# Patient Record
Sex: Male | Born: 1970 | Race: White | Hispanic: No | Smoking: Current every day smoker
Health system: Southern US, Community
[De-identification: ages and names within clinical notes are randomized; demographics above are authoritative.]

## PROBLEM LIST (undated history)

## (undated) DIAGNOSIS — G43909 Migraine, unspecified, not intractable, without status migrainosus: Secondary | ICD-10-CM

---

## 2017-01-09 ENCOUNTER — Encounter (HOSPITAL_COMMUNITY): Payer: Self-pay | Admitting: Emergency Medicine

## 2017-01-09 ENCOUNTER — Emergency Department (HOSPITAL_COMMUNITY)
Admission: EM | Admit: 2017-01-09 | Discharge: 2017-01-09 | Disposition: A | Discharge status: Home / Self Care | Payer: Self-pay | Attending: Emergency Medicine | Admitting: Emergency Medicine

## 2017-01-09 ENCOUNTER — Emergency Department (HOSPITAL_COMMUNITY): Payer: Self-pay

## 2017-01-09 DIAGNOSIS — F1123 Opioid dependence with withdrawal: Secondary | ICD-10-CM | POA: Insufficient documentation

## 2017-01-09 DIAGNOSIS — Z79899 Other long term (current) drug therapy: Secondary | ICD-10-CM | POA: Insufficient documentation

## 2017-01-09 DIAGNOSIS — F1193 Opioid use, unspecified with withdrawal: Secondary | ICD-10-CM

## 2017-01-09 LAB — COMPREHENSIVE METABOLIC PANEL
ALT: 15 U/L — AB (ref 17–63)
ANION GAP: 7 (ref 5–15)
AST: 22 U/L (ref 15–41)
Albumin: 3.9 g/dL (ref 3.5–5.0)
Alkaline Phosphatase: 70 U/L (ref 38–126)
BUN: 14 mg/dL (ref 6–20)
CHLORIDE: 103 mmol/L (ref 101–111)
CO2: 27 mmol/L (ref 22–32)
CREATININE: 0.9 mg/dL (ref 0.61–1.24)
Calcium: 9 mg/dL (ref 8.9–10.3)
Glucose, Bld: 126 mg/dL — ABNORMAL HIGH (ref 65–99)
POTASSIUM: 3.6 mmol/L (ref 3.5–5.1)
Sodium: 137 mmol/L (ref 135–145)
Total Bilirubin: 0.6 mg/dL (ref 0.3–1.2)
Total Protein: 7.3 g/dL (ref 6.5–8.1)

## 2017-01-09 LAB — CBC WITH DIFFERENTIAL/PLATELET
BASOS ABS: 0.1 10*3/uL (ref 0.0–0.1)
Basophils Relative: 1 %
EOS PCT: 6 %
Eosinophils Absolute: 0.4 10*3/uL (ref 0.0–0.7)
HCT: 40.3 % (ref 39.0–52.0)
HEMOGLOBIN: 13.8 g/dL (ref 13.0–17.0)
LYMPHS ABS: 1.5 10*3/uL (ref 0.7–4.0)
LYMPHS PCT: 23 %
MCH: 29.5 pg (ref 26.0–34.0)
MCHC: 34.2 g/dL (ref 30.0–36.0)
MCV: 86.1 fL (ref 78.0–100.0)
Monocytes Absolute: 0.7 10*3/uL (ref 0.1–1.0)
Monocytes Relative: 11 %
NEUTROS ABS: 3.7 10*3/uL (ref 1.7–7.7)
NEUTROS PCT: 59 %
PLATELETS: 226 10*3/uL (ref 150–400)
RBC: 4.68 MIL/uL (ref 4.22–5.81)
RDW: 14.2 % (ref 11.5–15.5)
WBC: 6.4 10*3/uL (ref 4.0–10.5)

## 2017-01-09 LAB — I-STAT CG4 LACTIC ACID, ED: Lactic Acid, Venous: 1.14 mmol/L (ref 0.5–1.9)

## 2017-01-09 LAB — ETHANOL

## 2017-01-09 MED ORDER — DICYCLOMINE HCL 20 MG PO TABS: 20.0000 mg | ORAL_TABLET | Freq: Two times a day (BID) | ORAL | 0 refills | Status: AC | PRN

## 2017-01-09 MED ORDER — NAPROXEN 500 MG PO TABS: 500.0000 mg | ORAL_TABLET | Freq: Two times a day (BID) | ORAL | 0 refills | Status: AC

## 2017-01-09 MED ORDER — ONDANSETRON HCL 4 MG/2ML IJ SOLN
4.0000 mg | Freq: Once | INTRAMUSCULAR | Status: AC
  Administered 2017-01-09: 4 mg via INTRAVENOUS
  Filled 2017-01-09: qty 2

## 2017-01-09 MED ORDER — ONDANSETRON HCL 4 MG PO TABS: 4.0000 mg | ORAL_TABLET | Freq: Three times a day (TID) | ORAL | 0 refills | Status: AC | PRN

## 2017-01-09 MED ORDER — CLONIDINE HCL 0.1 MG PO TABS: 0.1000 mg | ORAL_TABLET | Freq: Every day | ORAL | 11 refills | Status: AC

## 2017-01-09 MED ORDER — SODIUM CHLORIDE 0.9 % IV BOLUS (SEPSIS)
1000.0000 mL | Freq: Once | INTRAVENOUS | Status: AC
  Administered 2017-01-09: 1000 mL via INTRAVENOUS

## 2017-01-09 NOTE — ED Provider Notes (Signed)
[facility]-EMERGENCY DEPT Provider Note   CSN: [id number] Arrival date & time: 01/09/17  1559     History   Chief Complaint Chief Complaint  Patient presents with  . drugs withdrawls    heroin and cocaine     HPI Christian Hill is a 46 y.o. male.  HPI Pt presents to the ED with possible drug withdrawal.  He was in police custody when he told them he was nauseated and chilled.  He has been coughing. No vomiting.  No fevers.  No diarrhea.  The last time he used any drugs ,heroine and cocaine was at 4 am.    He uses daily.   History reviewed. No pertinent past medical history.  There are no active problems to display for this patient.   History reviewed. No pertinent surgical history.     Home Medications    Prior to Admission medications   Medication Sig Start Date End Date Taking? Authorizing Provider  cloNIDine (CATAPRES) 0.1 MG tablet Take 1 tablet (0.1 mg total) by mouth daily. 01/09/17   Linwood Dibbles, MD  dicyclomine (BENTYL) 20 MG tablet Take 1 tablet (20 mg total) by mouth 2 (two) times daily as needed for spasms. 01/09/17   Linwood Dibbles, MD  naproxen (NAPROSYN) 500 MG tablet Take 1 tablet (500 mg total) by mouth 2 (two) times daily with a meal. As needed for pain 01/09/17   Linwood Dibbles, MD  ondansetron (ZOFRAN) 4 MG tablet Take 1 tablet (4 mg total) by mouth every 8 (eight) hours as needed for nausea or vomiting. 01/09/17   Linwood Dibbles, MD    Family History No family history on file.  Social History Social History  Substance Use Topics  . Smoking status: Not on file  . Smokeless tobacco: Not on file  . Alcohol use Not on file     Allergies   Patient has no known allergies.   Review of Systems Review of Systems  Constitutional: Negative for fever.  Neurological: Positive for headaches.  All other systems reviewed and are negative.    Physical Exam Updated Vital Signs BP 127/76 (BP Location: Left Arm)   Pulse 66   Temp 98.7 F (37.1 C) (Oral)   Resp 18   Ht 5'  4" (1.626 m)   Wt 68 kg   SpO2 96%   BMI 25.75 kg/m   Physical Exam  Constitutional: He appears well-developed and well-nourished. No distress.  HENT:  Head: Normocephalic and atraumatic.  Right Ear: External ear normal.  Left Ear: External ear normal.  Eyes: Conjunctivae are normal. Right eye exhibits no discharge. Left eye exhibits no discharge. No scleral icterus.  Neck: Neck supple. No tracheal deviation present.  Cardiovascular: Normal rate, regular rhythm and intact distal pulses.   Pulmonary/Chest: Effort normal and breath sounds normal. No stridor. No respiratory distress. He has no wheezes. He has no rales.  Abdominal: Soft. Bowel sounds are normal. He exhibits no distension. There is no tenderness. There is no rebound and no guarding.  Musculoskeletal: He exhibits tenderness. He exhibits no edema.  Mild erythema left ac and upper forearm, ttp, no fluctuance, no induration, no lymphangitic streaking  Neurological: He is alert. He has normal strength. No cranial nerve deficit (no facial droop, extraocular movements intact, no slurred speech) or sensory deficit. He exhibits normal muscle tone. He displays no seizure activity. Coordination normal.  Skin: Skin is warm and dry.  Psychiatric: He has a normal mood and affect.  Nursing note and vitals reviewed.  ED Treatments / Results  Labs (all labs ordered are listed, but only abnormal results are displayed) Labs Reviewed  COMPREHENSIVE METABOLIC PANEL - Abnormal; Notable for the following:       Result Value   Glucose, Bld 126 (*)    ALT 15 (*)    All other components within normal limits  ETHANOL  CBC WITH DIFFERENTIAL/PLATELET  HIV ANTIBODY (ROUTINE TESTING)  I-STAT CG4 LACTIC ACID, ED    EKG  EKG Interpretation None       Radiology Dg Chest 2 View  Result Date: 01/09/2017 CLINICAL DATA:  Chills, shortness of breath and chest pain for 4 days. EXAM: CHEST  2 VIEW COMPARISON:  None. FINDINGS: The cardiac  silhouette, mediastinal and hilar contours are within normal limits. There is a small right-sided pleural effusion with minimal overlying atelectasis. The left lung is clear. The bony thorax is intact. IMPRESSION: Right-sided pleural effusion but no definite infiltrates or edema. Electronically Signed   By: Rudie Meyer M.D.   On: 01/09/2017 17:52    Procedures Procedures (including critical care time)  Medications Ordered in ED Medications  sodium chloride 0.9 % bolus 1,000 mL (1,000 mLs Intravenous New Bag/Given 01/09/17 1718)  ondansetron (ZOFRAN) injection 4 mg (4 mg Intravenous Given 01/09/17 1717)     Initial Impression / Assessment and Plan / ED Course  I have reviewed the triage vital signs and the nursing notes.  Pertinent labs & imaging results that were available during my care of the patient were reviewed by me and considered in my medical decision making (see chart for details).   patient does not have any evidence of infection or acute laboratory abnormalities.  The patient's symptoms could be related to opiate withdrawal. He denies any suicidal or homicidal ideation. Patient is in police custody. He is stable to be released. I will give him prescriptions to help with opiate withdrawal. Final Clinical Impressions(s) / ED Diagnoses   Final diagnoses:  Opiate withdrawal (HCC)    New Prescriptions New Prescriptions   CLONIDINE (CATAPRES) 0.1 MG TABLET    Take 1 tablet (0.1 mg total) by mouth daily.   DICYCLOMINE (BENTYL) 20 MG TABLET    Take 1 tablet (20 mg total) by mouth 2 (two) times daily as needed for spasms.   NAPROXEN (NAPROSYN) 500 MG TABLET    Take 1 tablet (500 mg total) by mouth 2 (two) times daily with a meal. As needed for pain   ONDANSETRON (ZOFRAN) 4 MG TABLET    Take 1 tablet (4 mg total) by mouth every 8 (eight) hours as needed for nausea or vomiting.     Linwood Dibbles, MD 01/09/17 [phone]

## 2017-01-09 NOTE — ED Notes (Signed)
Bed: ZO10 Expected date:  Expected time:  Means of arrival:  Comments: EMS heroin

## 2017-01-09 NOTE — Discharge Instructions (Signed)
Please refer to the discharge instructions regarding outpatient resources to help you with your opiate use

## 2017-01-09 NOTE — ED Triage Notes (Signed)
Per EMS pt was under police custody when pt claimed been nauseated and reported heroin and cocaine use and alcohol this AM. Pt was diaphoretic upon arrival. Denies pain , no emesis. No trumors per EMS. Alert and oriented x 4.

## 2017-01-10 LAB — HIV ANTIBODY (ROUTINE TESTING W REFLEX): HIV Screen 4th Generation wRfx: NONREACTIVE

## 2021-10-27 ENCOUNTER — Emergency Department (HOSPITAL_COMMUNITY)
Admission: EM | Admit: 2021-10-27 | Discharge: 2021-10-27 | Disposition: A | Discharge status: Home / Self Care | Payer: Commercial Managed Care - HMO | Attending: Emergency Medicine | Admitting: Emergency Medicine

## 2021-10-27 ENCOUNTER — Emergency Department (HOSPITAL_COMMUNITY): Payer: Commercial Managed Care - HMO

## 2021-10-27 ENCOUNTER — Encounter (HOSPITAL_COMMUNITY): Payer: Self-pay | Admitting: Emergency Medicine

## 2021-10-27 ENCOUNTER — Other Ambulatory Visit: Payer: Self-pay

## 2021-10-27 DIAGNOSIS — R630 Anorexia: Secondary | ICD-10-CM | POA: Insufficient documentation

## 2021-10-27 DIAGNOSIS — R0602 Shortness of breath: Secondary | ICD-10-CM | POA: Diagnosis not present

## 2021-10-27 DIAGNOSIS — Z20822 Contact with and (suspected) exposure to covid-19: Secondary | ICD-10-CM | POA: Insufficient documentation

## 2021-10-27 DIAGNOSIS — R059 Cough, unspecified: Secondary | ICD-10-CM | POA: Diagnosis not present

## 2021-10-27 DIAGNOSIS — R1013 Epigastric pain: Secondary | ICD-10-CM | POA: Insufficient documentation

## 2021-10-27 HISTORY — DX: Migraine, unspecified, not intractable, without status migrainosus: G43.909

## 2021-10-27 LAB — COMPREHENSIVE METABOLIC PANEL
ALT: 15 U/L (ref 0–44)
AST: 22 U/L (ref 15–41)
Albumin: 3.8 g/dL (ref 3.5–5.0)
Alkaline Phosphatase: 54 U/L (ref 38–126)
Anion gap: 8 (ref 5–15)
BUN: 9 mg/dL (ref 6–20)
CO2: 29 mmol/L (ref 22–32)
Calcium: 9.6 mg/dL (ref 8.9–10.3)
Chloride: 100 mmol/L (ref 98–111)
Creatinine, Ser: 1.06 mg/dL (ref 0.61–1.24)
GFR, Estimated: >60 mL/min (ref >60)
Glucose, Bld: 94 mg/dL (ref 70–99)
Potassium: 4.1 mmol/L (ref 3.5–5.1)
Sodium: 137 mmol/L (ref 135–145)
Total Bilirubin: 0.3 mg/dL (ref 0.3–1.2)
Total Protein: 7.1 g/dL (ref 6.5–8.1)

## 2021-10-27 LAB — CBC
HCT: 44.3 % (ref 39.0–52.0)
Hemoglobin: 15.1 g/dL (ref 13.0–17.0)
MCH: 30.3 pg (ref 26.0–34.0)
MCHC: 34.1 g/dL (ref 30.0–36.0)
MCV: 89 fL (ref 80.0–100.0)
Platelets: 254 10*3/uL (ref 150–400)
RBC: 4.98 MIL/uL (ref 4.22–5.81)
RDW: 13.1 % (ref 11.5–15.5)
WBC: 7.8 10*3/uL (ref 4.0–10.5)
nRBC: 0 % (ref 0.0–0.2)

## 2021-10-27 LAB — RESP PANEL BY RT-PCR (FLU A&B, COVID) ARPGX2
Influenza A by PCR: NEGATIVE
Influenza B by PCR: NEGATIVE
SARS Coronavirus 2 by RT PCR: NEGATIVE

## 2021-10-27 LAB — LIPASE, BLOOD: Lipase: 30 U/L (ref 11–51)

## 2021-10-27 LAB — TROPONIN I (HIGH SENSITIVITY)
Troponin I (High Sensitivity): 4 ng/L (ref <18)
Troponin I (High Sensitivity): 3 ng/L (ref <18)

## 2021-10-27 MED ORDER — IOHEXOL 350 MG/ML SOLN
80.0000 mL | Freq: Once | INTRAVENOUS | Status: AC | PRN
  Administered 2021-10-27: 80 mL via INTRAVENOUS

## 2021-10-27 NOTE — ED Notes (Signed)
Patient transported to CT 

## 2021-10-27 NOTE — Discharge Instructions (Signed)
Follow-up with your primary care doctor.  Your work-up was reassuring today.

## 2021-10-27 NOTE — ED Triage Notes (Signed)
Patient here with complaint of shortness of breath and left sided chest pain that started approximately one month ago, history of pneumonia in 2008. Patient alert, oriented, speaking in complete sentences, and is in no apparent distress at this time.

## 2021-10-27 NOTE — ED Provider Triage Note (Signed)
Emergency Medicine Provider Triage Evaluation Note  Christian Hill , a 51 y.o. male  was evaluated in triage.  Pt complains of shortness of breath and intermittent chest pain and left side pain over the past 1 month.  He is a smoker.  He has had a cough.  States that symptoms are getting worse.  No reported fevers.  Review of Systems  Positive: Shortness of breath, chest pain Negative: Fevers  Physical Exam  BP 108/80 (BP Location: Right Arm)    Pulse 94    Temp 97.6 F (36.4 C) (Oral)    Resp 16    SpO2 98%  Gen:   Awake, no distress   Resp:  Shallow breathing, lungs clear but decreased air movement MSK:   Moves extremities without difficulty  Other:    Medical Decision Making  Medically screening exam initiated at 1:11 PM.  Appropriate orders placed.  [name] was informed that the remainder of the evaluation will be completed by another provider, this initial triage assessment does not replace that evaluation, and the importance of remaining in the ED until their evaluation is complete.     Renne Crigler, PA-C 10/27/21 1312

## 2021-10-27 NOTE — ED Provider Notes (Signed)
[facility] EMERGENCY DEPARTMENT Provider Note   CSN: [id number] Arrival date & time: 10/27/21  1216     History  Chief Complaint  Patient presents with   Shortness of Breath    Christian Hill is a 51 y.o. male.   Shortness of Breath Associated symptoms: abdominal pain and chest pain   Associated symptoms: no fever   Patient presents with shortness of breath abdominal pain.  Occasional cough.  States it has been going for a month.  States it hurts in his left lower chest.  States he is lost 2 inches on his waist.  Decreased appetite.  He is a smoker.  States he had previous pneumonia and required a drain.  States he thought this was on the left side of the chest but it appears that it was on the right side.  Pain not worse after eating.  No swelling in his legs.  Remote history of IV drug use.  Not really having back pain more lateral to abdominal pain.    Home Medications Prior to Admission medications   Medication Sig Start Date End Date Taking? Authorizing Provider  cloNIDine (CATAPRES) 0.1 MG tablet Take 1 tablet (0.1 mg total) by mouth daily. 01/09/17   Linwood Dibbles, MD  dicyclomine (BENTYL) 20 MG tablet Take 1 tablet (20 mg total) by mouth 2 (two) times daily as needed for spasms. 01/09/17   Linwood Dibbles, MD  naproxen (NAPROSYN) 500 MG tablet Take 1 tablet (500 mg total) by mouth 2 (two) times daily with a meal. As needed for pain 01/09/17   Linwood Dibbles, MD  ondansetron (ZOFRAN) 4 MG tablet Take 1 tablet (4 mg total) by mouth every 8 (eight) hours as needed for nausea or vomiting. 01/09/17   Linwood Dibbles, MD      Allergies    Patient has no known allergies.    Review of Systems   Review of Systems  Constitutional:  Positive for appetite change and unexpected weight change. Negative for fever.  HENT:  Negative for congestion.   Respiratory:  Positive for shortness of breath.   Cardiovascular:  Positive for chest pain.  Gastrointestinal:  Positive for abdominal  pain.  Genitourinary:  Negative for flank pain.  Musculoskeletal:  Negative for back pain.  Neurological:  Negative for weakness.   Physical Exam Updated Vital Signs BP (!) 103/55    Pulse (!) 53    Temp (!) 97.4 F (36.3 C) (Oral)    Resp (!) 24    Ht 5\' 4"  (1.626 m)    Wt 68 kg    SpO2 96%    BMI 25.75 kg/m  Physical Exam Vitals and nursing note reviewed.  HENT:     Head: Normocephalic.  Cardiovascular:     Rate and Rhythm: Normal rate.  Pulmonary:     Comments: Scars on right chest wall from previous chest tube and surgery. Chest:     Chest wall: No tenderness.  Abdominal:     Tenderness: There is abdominal tenderness.     Comments: Epigastric to left upper quadrant abdominal tenderness.  No rebound or guarding.  No hernias palpated.  Musculoskeletal:     Cervical back: Neck supple.     Right lower leg: No edema.     Left lower leg: No edema.     Comments: No spinal tenderness.  Skin:    General: Skin is warm.     Capillary Refill: Capillary refill takes less than 2 seconds.  Neurological:  Mental Status: He is alert and oriented to person, place, and time.    ED Results / Procedures / Treatments   Labs (all labs ordered are listed, but only abnormal results are displayed) Labs Reviewed  RESP PANEL BY RT-PCR (FLU A&B, COVID) [id number]  CBC  COMPREHENSIVE METABOLIC PANEL  LIPASE, BLOOD  TROPONIN I (HIGH SENSITIVITY)  TROPONIN I (HIGH SENSITIVITY)    EKG EKG Interpretation  Date/Time:  Wednesday October 27 2021 13:01:37 EST Ventricular Rate:  61 PR Interval:  146 QRS Duration: 84 QT Interval:  388 QTC Calculation: 390 R Axis:   75 Text Interpretation: Normal sinus rhythm Normal ECG No previous ECGs available Confirmed by Benjiman CorePickering, Maddox Hlavaty [phone]([id number]) on 10/27/2021 3:29:19 PM  Radiology DG Chest 2 View  Result Date: 10/27/2021 CLINICAL DATA:  Shortness of breath, left-sided chest pain, remote history of pneumonia EXAM: CHEST - 2 VIEW COMPARISON:  01/09/2017  FINDINGS: The heart size and mediastinal contours are within normal limits. Unchanged elevation of the right hemidiaphragm with associated pleural thickening or chronic, loculated pleural effusion. The lungs are otherwise normally aerated. The visualized skeletal structures are unremarkable. IMPRESSION: Unchanged elevation of the right hemidiaphragm with associated pleural thickening or chronic, loculated pleural effusion. There is no acute appearing airspace opacity. Electronically Signed   By: Jearld LeschAlex D Bibbey M.D.   On: 10/27/2021 13:36   CT CHEST ABDOMEN PELVIS W CONTRAST  Result Date: 10/27/2021 CLINICAL DATA:  Left upper quadrant abdominal pain. Shortness of breath. EXAM: CT CHEST, ABDOMEN, AND PELVIS WITH CONTRAST TECHNIQUE: Multidetector CT imaging of the chest, abdomen and pelvis was performed following the standard protocol during bolus administration of intravenous contrast. RADIATION DOSE REDUCTION: This exam was performed according to the departmental dose-optimization program which includes automated exposure control, adjustment of the mA and/or kV according to patient size and/or use of iterative reconstruction technique. CONTRAST:  80mL OMNIPAQUE IOHEXOL 350 MG/ML SOLN COMPARISON:  None. FINDINGS: CT CHEST FINDINGS Cardiovascular: No significant vascular findings. Normal heart size. No pericardial effusion. Mediastinum/Nodes: No enlarged mediastinal, hilar, or axillary lymph nodes. Thyroid gland, trachea, and esophagus demonstrate no significant findings. Lungs/Pleura: There is minimal scarring in the right lower lobe. The lungs are otherwise clear. There is no pleural effusion or pneumothorax. Musculoskeletal: A portion of the right eighth rib is missing which may be related to prior surgery. No acute fractures are seen. CT ABDOMEN PELVIS FINDINGS Hepatobiliary: No focal liver abnormality is seen. No gallstones, gallbladder wall thickening, or biliary dilatation. Pancreas: Unremarkable. No  pancreatic ductal dilatation or surrounding inflammatory changes. Spleen: Normal in size without focal abnormality. Adrenals/Urinary Tract: Adrenal glands are unremarkable. Kidneys are normal, without renal calculi, focal lesion, or hydronephrosis. Bladder is unremarkable. Stomach/Bowel: Stomach is within normal limits. Appendix appears normal. No evidence of bowel wall thickening, distention, or inflammatory changes. Vascular/Lymphatic: Aortic atherosclerosis. No enlarged abdominal or pelvic lymph nodes. Reproductive: Prostate is unremarkable. Other: No abdominal wall hernia or abnormality. No abdominopelvic ascites. Musculoskeletal: No acute or significant osseous findings. IMPRESSION: 1. No acute localizing process in the chest, abdomen or pelvis. Electronically Signed   By: Darliss CheneyAmy  Guttmann M.D.   On: 10/27/2021 17:42    Procedures Procedures    Medications Ordered in ED Medications  iohexol (OMNIPAQUE) 350 MG/ML injection 80 mL (80 mLs Intravenous Contrast Given 10/27/21 1733)    ED Course/ Medical Decision Making/ A&P Clinical Course as of 10/28/21 0001  Wed Oct 27, 2021  1625 Troponin I (High Sensitivity) normal [NP]  1625 Comprehensive metabolic panel Reassuring  [  NP]  1625 DG Chest 2 View Chronic right sided changes [NP]    Clinical Course User Index [NP] Benjiman Core, MD                           Medical Decision Making Amount and/or Complexity of Data Reviewed Labs: ordered. Decision-making details documented in ED Course. Radiology: ordered. Decision-making details documented in ED Course.  Risk Prescription drug management.   Patient presents with chest pain and abdominal pain.  Has had for the last month.  It is on the left lower chest and left upper abdomen.  States worse with breathing.  States worse with eating and certain positions.  No fevers or chills.  Has lost 2 inches of his waist.  States he has not been trying to lose the weight.  Somewhat decreased  appetite.  Lab work done reassuring.  Chest x-ray did not show anything on the left side of the penis.  CT scan done to evaluate for occult malignancy.  None seen.  Appears stable for discharge home.  Will have follow-up with PCP.  Does not appear to meet admission to the hospital for this.  Initial differential diagnosis for chest and abdominal pain is long time includes diagnose such as chest pain, tumors, anemia        Final Clinical Impression(s) / ED Diagnoses Final diagnoses:  Epigastric pain    Rx / DC Orders ED Discharge Orders     None         Benjiman Core, MD 10/28/21 0001

## 2021-11-22 ENCOUNTER — Emergency Department (HOSPITAL_COMMUNITY): Payer: Commercial Managed Care - HMO

## 2021-11-22 ENCOUNTER — Emergency Department (HOSPITAL_COMMUNITY)
Admission: EM | Admit: 2021-11-22 | Discharge: 2021-11-22 | Disposition: A | Discharge status: Home / Self Care | Payer: Commercial Managed Care - HMO | Attending: Emergency Medicine | Admitting: Emergency Medicine

## 2021-11-22 ENCOUNTER — Encounter (HOSPITAL_COMMUNITY): Payer: Self-pay | Admitting: Emergency Medicine

## 2021-11-22 DIAGNOSIS — R1032 Left lower quadrant pain: Secondary | ICD-10-CM | POA: Diagnosis not present

## 2021-11-22 DIAGNOSIS — R519 Headache, unspecified: Secondary | ICD-10-CM

## 2021-11-22 LAB — COMPREHENSIVE METABOLIC PANEL
ALT: 16 U/L (ref 0–44)
AST: 25 U/L (ref 15–41)
Albumin: 3.8 g/dL (ref 3.5–5.0)
Alkaline Phosphatase: 62 U/L (ref 38–126)
Anion gap: 8 (ref 5–15)
BUN: 10 mg/dL (ref 6–20)
CO2: 25 mmol/L (ref 22–32)
Calcium: 9 mg/dL (ref 8.9–10.3)
Chloride: 104 mmol/L (ref 98–111)
Creatinine, Ser: 1.05 mg/dL (ref 0.61–1.24)
GFR, Estimated: >60 mL/min (ref >60)
Glucose, Bld: 89 mg/dL (ref 70–99)
Potassium: 3.9 mmol/L (ref 3.5–5.1)
Sodium: 137 mmol/L (ref 135–145)
Total Bilirubin: 0.4 mg/dL (ref 0.3–1.2)
Total Protein: 7 g/dL (ref 6.5–8.1)

## 2021-11-22 LAB — CBC WITH DIFFERENTIAL/PLATELET
Abs Immature Granulocytes: 0.02 10*3/uL (ref 0.00–0.07)
Basophils Absolute: 0.1 10*3/uL (ref 0.0–0.1)
Basophils Relative: 1 %
Eosinophils Absolute: 0.3 10*3/uL (ref 0.0–0.5)
Eosinophils Relative: 4 %
HCT: 42 % (ref 39.0–52.0)
Hemoglobin: 14 g/dL (ref 13.0–17.0)
Immature Granulocytes: 0 %
Lymphocytes Relative: 23 %
Lymphs Abs: 1.6 10*3/uL (ref 0.7–4.0)
MCH: 30.1 pg (ref 26.0–34.0)
MCHC: 33.3 g/dL (ref 30.0–36.0)
MCV: 90.3 fL (ref 80.0–100.0)
Monocytes Absolute: 0.6 10*3/uL (ref 0.1–1.0)
Monocytes Relative: 9 %
Neutro Abs: 4.5 10*3/uL (ref 1.7–7.7)
Neutrophils Relative %: 63 %
Platelets: 226 10*3/uL (ref 150–400)
RBC: 4.65 MIL/uL (ref 4.22–5.81)
RDW: 13.2 % (ref 11.5–15.5)
WBC: 7.1 10*3/uL (ref 4.0–10.5)
nRBC: 0 % (ref 0.0–0.2)

## 2021-11-22 MED ORDER — DEXAMETHASONE SODIUM PHOSPHATE 10 MG/ML IJ SOLN
10.0000 mg | Freq: Once | INTRAMUSCULAR | Status: AC
  Administered 2021-11-22: 10 mg via INTRAVENOUS
  Filled 2021-11-22: qty 1

## 2021-11-22 MED ORDER — METOCLOPRAMIDE HCL 5 MG/ML IJ SOLN
5.0000 mg | Freq: Once | INTRAMUSCULAR | Status: AC
  Administered 2021-11-22: 5 mg via INTRAVENOUS
  Filled 2021-11-22: qty 2

## 2021-11-22 MED ORDER — KETOROLAC TROMETHAMINE 30 MG/ML IJ SOLN
15.0000 mg | Freq: Once | INTRAMUSCULAR | Status: AC
  Administered 2021-11-22: 15 mg via INTRAVENOUS
  Filled 2021-11-22: qty 1

## 2021-11-22 MED ORDER — DIPHENHYDRAMINE HCL 50 MG/ML IJ SOLN
12.5000 mg | Freq: Once | INTRAMUSCULAR | Status: AC
  Administered 2021-11-22: 12.5 mg via INTRAVENOUS
  Filled 2021-11-22: qty 1

## 2021-11-22 MED ORDER — BUTALBITAL-APAP-CAFFEINE 50-325-40 MG PO TABS: 1.0000 | ORAL_TABLET | Freq: Three times a day (TID) | ORAL | 0 refills | Status: AC | PRN

## 2021-11-22 MED ORDER — SODIUM CHLORIDE 0.9 % IV BOLUS
500.0000 mL | Freq: Once | INTRAVENOUS | Status: AC
  Administered 2021-11-22: 500 mL via INTRAVENOUS

## 2021-11-22 NOTE — Discharge Instructions (Signed)

## 2021-11-22 NOTE — ED Provider Triage Note (Signed)
Emergency Medicine Provider Triage Evaluation Note  Christian Hill , a 51 y.o. male  was evaluated in triage.  Pt complains of headache that has been ongoing for years but worse over the last week.  Hurts when he moves his head.  No visual disturbances.  No weakness or numbness.  Review of Systems  Positive:  Negative: See above   Physical Exam  BP 106/73 (BP Location: Right Arm)    Pulse 79    Temp 98 F (36.7 C) (Oral)    Resp 16    SpO2 96%  Gen:   Awake, no distress   Resp:  Normal effort  MSK:   Moves extremities without difficulty  Other:    Medical Decision Making  Medically screening exam initiated at 12:49 PM.  Appropriate orders placed.  Christian Hill was informed that the remainder of the evaluation will be completed by another provider, this initial triage assessment does not replace that evaluation, and the importance of remaining in the ED until their evaluation is complete.     Christian Hill, [place] 11/22/21 1249

## 2021-11-22 NOTE — ED Provider Notes (Signed)
[facility] EMERGENCY DEPARTMENT Provider Note   CSN: [id number] Arrival date & time: 11/22/21  1158     History  Chief Complaint  Patient presents with   Headache    Christian Hill is a 51 y.o. male who presents emergency department with chief complaint of headache.  Patient complains that he has had an ongoing headache for the past 2 weeks which has been intermittent.  He states that the pain starts in his right occiput and goes across his entire right side of his head.  It is at times throbbing and hurts behind his eye.  He has been taking ibuprofen almost every 2 hours without any improvement in his headache and has been constant for the past 2 days.  He has some associated light sensitivity.  He has never had headaches like this in the past.  He denies nausea, vomiting, fever, sudden onset.  Patient also complains of pain in his left abdominal region.  He states that he thinks he pulled a muscle when playing basketball with some "young guys."  Pain is worse when he tries to sit up or twist.  He has no other complaints.   Headache     Home Medications Prior to Admission medications   Medication Sig Start Date End Date Taking? Authorizing Provider  cloNIDine (CATAPRES) 0.1 MG tablet Take 1 tablet (0.1 mg total) by mouth daily. 01/09/17   Linwood Dibbles, MD  dicyclomine (BENTYL) 20 MG tablet Take 1 tablet (20 mg total) by mouth 2 (two) times daily as needed for spasms. 01/09/17   Linwood Dibbles, MD  naproxen (NAPROSYN) 500 MG tablet Take 1 tablet (500 mg total) by mouth 2 (two) times daily with a meal. As needed for pain 01/09/17   Linwood Dibbles, MD  ondansetron (ZOFRAN) 4 MG tablet Take 1 tablet (4 mg total) by mouth every 8 (eight) hours as needed for nausea or vomiting. 01/09/17   Linwood Dibbles, MD      Allergies    Patient has no known allergies.    Review of Systems   Review of Systems  Neurological:  Positive for headaches.   Physical Exam Updated Vital Signs BP  106/73 (BP Location: Right Arm)    Pulse 79    Temp 98 F (36.7 C) (Oral)    Resp 16    SpO2 96%  Physical Exam Physical Exam  Constitutional: Pt is oriented to person, place, and time. Pt appears well-developed and well-nourished. No distress.  HENT:  Head: Normocephalic and atraumatic.  Mouth/Throat: Oropharynx is clear and moist.  Eyes: Conjunctivae and EOM are normal. Pupils are equal, round, and reactive to light. No scleral icterus.  No horizontal, vertical or rotational nystagmus  Neck: Normal range of motion. Neck supple.  Full active and passive ROM Tender trigger point in the R suboccipital region. Palpation reproduces pain of complaint. No midline or tenderness No nuchal rigidity or meningeal signs  Cardiovascular: Normal rate, regular rhythm and intact distal pulses.   Pulmonary/Chest: Effort normal and breath sounds normal. No respiratory distress. Pt has no wheezes. No rales.  Abdominal: Soft. Bowel sounds are normal. There is no tenderness. There is no rebound and no guarding.  Musculoskeletal: Normal range of motion.  Lymphadenopathy:    No cervical adenopathy.  Neurological: Pt. is alert and oriented to person, place, and time. He has normal reflexes. No cranial nerve deficit.  Exhibits normal muscle tone. Coordination normal.  Mental Status:  Alert, oriented, thought content appropriate. Speech fluent  without evidence of aphasia. Able to follow 2 step commands without difficulty.  Cranial Nerves:  II:  Peripheral visual fields grossly normal, pupils equal, round, reactive to light III,IV, VI: ptosis not present, extra-ocular motions intact bilaterally  V,VII: smile symmetric, facial light touch sensation equal VIII: hearing grossly normal bilaterally  IX,X: midline uvula rise  XI: bilateral shoulder shrug equal and strong XII: midline tongue extension  Motor:  5/5 in upper and lower extremities bilaterally including strong and equal grip strength and  dorsiflexion/plantar flexion Sensory: Pinprick and light touch normal in all extremities.  Deep Tendon Reflexes: 2+ and symmetric  Cerebellar: normal finger-to-nose with bilateral upper extremities Gait: normal gait and balance CV: distal pulses palpable throughout   Skin: Skin is warm and dry. No rash noted. Pt is not diaphoretic.  Psychiatric: Pt has a normal mood and affect. Behavior is normal. Judgment and thought content normal.  Nursing note and vitals reviewed.  ED Results / Procedures / Treatments   Labs (all labs ordered are listed, but only abnormal results are displayed) Labs Reviewed  COMPREHENSIVE METABOLIC PANEL  CBC WITH DIFFERENTIAL/PLATELET  URINALYSIS, ROUTINE W REFLEX MICROSCOPIC    EKG None  Radiology CT Head Wo Contrast  Result Date: 11/22/2021 CLINICAL DATA:  Headache. EXAM: CT HEAD WITHOUT CONTRAST TECHNIQUE: Contiguous axial images were obtained from the base of the skull through the vertex without intravenous contrast. RADIATION DOSE REDUCTION: This exam was performed according to the departmental dose-optimization program which includes automated exposure control, adjustment of the mA and/or kV according to patient size and/or use of iterative reconstruction technique. COMPARISON:  None. FINDINGS: Brain: No evidence of acute infarction, hemorrhage, hydrocephalus, extra-axial collection or mass lesion/mass effect. Vascular: No hyperdense vessel or unexpected calcification. Skull: Normal. Negative for fracture or focal lesion. Sinuses/Orbits: No acute finding. Other: None. IMPRESSION: No acute intracranial abnormality seen. Electronically Signed   By: Marijo Conception M.D.   On: 11/22/2021 13:28    Procedures Procedures    Medications Ordered in ED Medications  sodium chloride 0.9 % bolus 500 mL (has no administration in time range)  metoCLOPramide (REGLAN) injection 5 mg (has no administration in time range)  diphenhydrAMINE (BENADRYL) injection 12.5 mg (has  no administration in time range)  ketorolac (TORADOL) 30 MG/ML injection 15 mg (has no administration in time range)  dexamethasone (DECADRON) injection 10 mg (has no administration in time range)    ED Course/ Medical Decision Making/ A&P Clinical Course as of 11/22/21 1541  Mon Nov 22, 2021  1540 CBC with Differential [AH]  1540 Comprehensive metabolic panel Labs WNL  [AH]  1540 CT Head Wo Contrast I visualized CT head, no acute findings on CT scan.  Agreed with radiologic interpretation. [AH]    Clinical Course User Index [AH] Margarita Mail, PA-C                           Medical Decision Making Christian Hill presents with headache Given the large differential diagnosis for Christian Hill, the decision making in this case is of high complexity.  After evaluating all of the data points in this case, the presentation of Christian Hill is NOT consistent with skull fracture, meningitis/encephalitis, SAH/sentinel bleed, Intracranial Hemorrhage (ICH) (subdural/epidural), acute obstructive hydrocephalus, space occupying lesions, CVA, CO Poisoning, Basilar/vertebral artery dissection, preeclampsia, cerebral venous thrombosis, hypertensive emergency, temporal Arteritis, Idiopathic Intracranial Hypertension (pseudotumor cerebri).  Strict return and follow-up precautions have been given by me personally or by  detailed written instructions verbalized by nursing staff using the teach back method to patient/family/caregiver.  Data Reviewed/Counseling: I have reviewed the patient's vital signs, nursing notes, and other relevant tests/information. I had a detailed discussion regarding the historical points, exam findings, and any diagnostic results supporting the discharge diagnosis. I also discussed the need for outpatient follow-up and the need to return to the ED if symptoms worsen or if there are any questions or concerns that arise at hom    Amount and/or Complexity of Data Reviewed Labs:  ordered. Decision-making details documented in ED Course. Radiology: independent interpretation performed. Decision-making details documented in ED Course.  Risk Prescription drug management. Parenteral controlled substances.            Final Clinical Impression(s) / ED Diagnoses Final diagnoses:  None    Rx / DC Orders ED Discharge Orders     None         Margarita Mail, PA-C 11/22/21 1733    Lorelle Gibbs, DO 11/22/21 2347

## 2021-11-22 NOTE — ED Triage Notes (Signed)
Patient here with complaint of posterior headache that started a few years ago. Patient states the headache was intermittent until a few weeks ago headache has become constant. Patient also complains of left flank pain that started several months ago. Patient alert, oriented, ambulatory, and in no apparent distress at this time.

## 2022-02-24 ENCOUNTER — Other Ambulatory Visit: Payer: Self-pay

## 2022-02-24 ENCOUNTER — Encounter (HOSPITAL_COMMUNITY): Payer: Self-pay

## 2022-02-24 ENCOUNTER — Emergency Department (HOSPITAL_COMMUNITY): Payer: No Typology Code available for payment source

## 2022-02-24 ENCOUNTER — Emergency Department (HOSPITAL_COMMUNITY)
Admission: EM | Admit: 2022-02-24 | Discharge: 2022-02-24 | Discharge status: Against Medical Advice | Payer: No Typology Code available for payment source | Attending: Emergency Medicine | Admitting: Emergency Medicine

## 2022-02-24 DIAGNOSIS — Z5321 Procedure and treatment not carried out due to patient leaving prior to being seen by health care provider: Secondary | ICD-10-CM | POA: Insufficient documentation

## 2022-02-24 DIAGNOSIS — D72829 Elevated white blood cell count, unspecified: Secondary | ICD-10-CM | POA: Diagnosis not present

## 2022-02-24 DIAGNOSIS — R079 Chest pain, unspecified: Secondary | ICD-10-CM | POA: Diagnosis present

## 2022-02-24 DIAGNOSIS — E876 Hypokalemia: Secondary | ICD-10-CM | POA: Insufficient documentation

## 2022-02-24 LAB — BASIC METABOLIC PANEL
Anion gap: 8 (ref 5–15)
BUN: 17 mg/dL (ref 6–20)
CO2: 27 mmol/L (ref 22–32)
Calcium: 9.1 mg/dL (ref 8.9–10.3)
Chloride: 103 mmol/L (ref 98–111)
Creatinine, Ser: 1.01 mg/dL (ref 0.61–1.24)
GFR, Estimated: >60 mL/min (ref >60)
Glucose, Bld: 131 mg/dL — ABNORMAL HIGH (ref 70–99)
Potassium: 3.4 mmol/L — ABNORMAL LOW (ref 3.5–5.1)
Sodium: 138 mmol/L (ref 135–145)

## 2022-02-24 LAB — CBC
HCT: 41.8 % (ref 39.0–52.0)
Hemoglobin: 13.8 g/dL (ref 13.0–17.0)
MCH: 30.5 pg (ref 26.0–34.0)
MCHC: 33 g/dL (ref 30.0–36.0)
MCV: 92.3 fL (ref 80.0–100.0)
Platelets: 233 10*3/uL (ref 150–400)
RBC: 4.53 MIL/uL (ref 4.22–5.81)
RDW: 13.6 % (ref 11.5–15.5)
WBC: 13.9 10*3/uL — ABNORMAL HIGH (ref 4.0–10.5)
nRBC: 0 % (ref 0.0–0.2)

## 2022-02-24 LAB — TROPONIN I (HIGH SENSITIVITY): Troponin I (High Sensitivity): 4 ng/L (ref <18)

## 2022-02-24 MED ORDER — LIDOCAINE VISCOUS HCL 2 % MT SOLN
15.0000 mL | Freq: Once | OROMUCOSAL | Status: AC
  Administered 2022-02-24: 15 mL via ORAL
  Filled 2022-02-24: qty 15

## 2022-02-24 MED ORDER — ALUM & MAG HYDROXIDE-SIMETH 200-200-20 MG/5ML PO SUSP
30.0000 mL | Freq: Once | ORAL | Status: AC
  Administered 2022-02-24: 30 mL via ORAL
  Filled 2022-02-24: qty 30

## 2022-02-24 NOTE — ED Provider Triage Note (Cosign Needed)
Emergency Medicine Provider Triage Evaluation Note  Christian Hill , a 51 y.o. male  was evaluated in triage.  Pt complains of chest pain and shortness of breath for the last 3 to 4 hours.  Patient denies any history of cardiac events.  Patient states the chest pain is located "all over" and does not radiate.  The pain is not worse with exertion.  Patient also endorsing shortness of breath.  Denies fevers, nausea, vomiting, abdominal pain.  Review of Systems  Positive:  Negative:   Physical Exam  BP 94/75 (BP Location: Left Arm)   Pulse 63   Temp 98.1 F (36.7 C) (Oral)   Resp 18   Ht 5\' 4"  (1.626 m)   Wt 72.6 kg   SpO2 98%   BMI 27.46 kg/m  Gen:   Awake, no distress   Resp:  Normal effort  MSK:   Moves extremities without difficulty  Other:  Lung sounds clear  Medical Decision Making  Medically screening exam initiated at 5:27 PM.  Appropriate orders placed.  Christian Hill was informed that the remainder of the evaluation will be completed by another provider, this initial triage assessment does not replace that evaluation, and the importance of remaining in the ED until their evaluation is complete.     Christian Gale, PA-C 02/24/22 1729

## 2022-02-24 NOTE — ED Notes (Signed)
Pt states he still would like to leave. RN educated pt about the difference between leaving AMA and getting discharged. Pt wishes to leave AMA.

## 2022-02-24 NOTE — ED Notes (Signed)
Pt signed AMA form, IV removed. Pt ambulated independently to lobby.

## 2022-02-24 NOTE — ED Provider Notes (Signed)
[facility] EMERGENCY DEPARTMENT Provider Note   CSN: [id number] Arrival date & time: 02/24/22  1713     History  Chief Complaint  Patient presents with   Chest Pain    Christian Hill is a 51 y.o. male.   Chest Pain  Patient with medical tobacco dependence presents today due to chest pain.  It started at 12 PM so almost 7 hours ago.  It moves "all over" the top of his chest, he also feels chest pain in the back of his neck which does not radiate up his jaw.  It is worse with any movement, breathing or coughing.  He feels short of breath but denies any nausea or vomiting.  Other than lying still nothing makes it better, he was given aspirin by EMS prior to arrival.  Denies any cardiac history, does not take medicine for any comorbidities.  Home Medications Prior to Admission medications   Medication Sig Start Date End Date Taking? Authorizing Provider  butalbital-acetaminophen-caffeine (FIORICET) 50-325-40 MG tablet Take 1-2 tablets by mouth every 8 (eight) hours as needed for headache. 11/22/21 11/22/22  Arthor Captain, PA-C  cloNIDine (CATAPRES) 0.1 MG tablet Take 1 tablet (0.1 mg total) by mouth daily. 01/09/17   Linwood Dibbles, MD  dicyclomine (BENTYL) 20 MG tablet Take 1 tablet (20 mg total) by mouth 2 (two) times daily as needed for spasms. 01/09/17   Linwood Dibbles, MD  naproxen (NAPROSYN) 500 MG tablet Take 1 tablet (500 mg total) by mouth 2 (two) times daily with a meal. As needed for pain 01/09/17   Linwood Dibbles, MD  ondansetron (ZOFRAN) 4 MG tablet Take 1 tablet (4 mg total) by mouth every 8 (eight) hours as needed for nausea or vomiting. 01/09/17   Linwood Dibbles, MD      Allergies    Patient has no known allergies.    Review of Systems   Review of Systems  Cardiovascular:  Positive for chest pain.   Physical Exam Updated Vital Signs BP 94/75 (BP Location: Left Arm)   Pulse 63   Temp 98.1 F (36.7 C) (Oral)   Resp 18   Ht 5\' 4"  (1.626 m)   Wt 72.6 kg   SpO2  98%   BMI 27.46 kg/m  Physical Exam Vitals and nursing note reviewed. Exam conducted with a chaperone present.  Constitutional:      Appearance: Normal appearance.  HENT:     Head: Normocephalic and atraumatic.  Eyes:     General: No scleral icterus.       Right eye: No discharge.        Left eye: No discharge.     Extraocular Movements: Extraocular movements intact.     Pupils: Pupils are equal, round, and reactive to light.  Cardiovascular:     Rate and Rhythm: Normal rate and regular rhythm.     Pulses: Normal pulses.     Heart sounds: Normal heart sounds. No murmur heard.   No friction rub. No gallop.  Pulmonary:     Effort: Pulmonary effort is normal. No respiratory distress.     Breath sounds: Normal breath sounds.  Abdominal:     General: Abdomen is flat. Bowel sounds are normal. There is no distension.     Palpations: Abdomen is soft.     Tenderness: There is no abdominal tenderness.  Skin:    General: Skin is warm and dry.     Coloration: Skin is not jaundiced.  Neurological:  Mental Status: He is alert. Mental status is at baseline.     Coordination: Coordination normal.    ED Results / Procedures / Treatments   Labs (all labs ordered are listed, but only abnormal results are displayed) Labs Reviewed  BASIC METABOLIC PANEL - Abnormal; Notable for the following components:      Result Value   Potassium 3.4 (*)    Glucose, Bld 131 (*)    All other components within normal limits  CBC - Abnormal; Notable for the following components:   WBC 13.9 (*)    All other components within normal limits  D-DIMER, QUANTITATIVE  TROPONIN I (HIGH SENSITIVITY)  TROPONIN I (HIGH SENSITIVITY)    EKG None  Radiology DG Chest 1 View  Result Date: 02/24/2022 CLINICAL DATA:  Chest pain for 3-4 hours.  Also endorses dizziness. EXAM: CHEST  1 VIEW COMPARISON:  Chest radiograph and chest CT October 27, 2021. FINDINGS: The heart size and mediastinal contours are within  normal limits. Similar elevation of the right hemidiaphragm with chronic pleural thickening/loculated pleural fluid. No new focal airspace consolidation. No pneumothorax. No acute osseous abnormality. IMPRESSION: No acute cardiopulmonary process. Similar elevation of the right hemidiaphragm with chronic pleural thickening/loculated pleural fluid. Electronically Signed   By: Maudry Mayhew M.D.   On: 02/24/2022 18:06    Procedures Procedures    Medications Ordered in ED Medications  alum & mag hydroxide-simeth (MAALOX/MYLANTA) 200-200-20 MG/5ML suspension 30 mL (30 mLs Oral Given 02/24/22 1931)    And  lidocaine (XYLOCAINE) 2 % viscous mouth solution 15 mL (15 mLs Oral Given 02/24/22 1931)    ED Course/ Medical Decision Making/ A&P Clinical Course as of 02/24/22 1940  Thu Feb 24, 2022  1937 DG Chest 1 View [HS]    Clinical Course User Index [HS] Theron Arista, [place]                           Medical Decision Making Amount and/or Complexity of Data Reviewed Labs: ordered.  Risk OTC drugs. Prescription drug management.   Patient presents with chest pain.  Differential includes but not limited to PE, ACS, pericarditis, pneumonia, pneumothorax, esophageal rupture.  On exam patient has soft blood pressures but is not technically hypotensive.  Not hypoxic or tachycardic.  Somewhat diminished lung sounds bilaterally.  S1-S2.  Laboratory work-up ordered and viewed by myself.  Based on interpretation patient has a leukocytosis at 13.9.  Slight hypokalemia with a potassium of 3.4.  No gross electrolyte derangement or AKI, initial troponin 4.  Given the pain was pleuritic and he is greater than 50 cannot use PERC to rule out PE.  Plan to order dimer  EKG shows sinus rhythm, no specific ischemic findings.  I ordered the chest x-ray.  There is chronic right pleural thickening which does not appear new compared to previous radiographs.  Agree with radiology interpretation.  Prior to obtaining the  dimer, patient told his RN that his pain resolved spontaneously and requested IV be removed.  He left prior to my evaluation, patient actually left AGAINST MEDICAL ADVICE.  His vitals are stable at the time of discharge, not hypoxic.  Appears to be Maalox/lidocaine GI cocktail helped improved his pain.         Final Clinical Impression(s) / ED Diagnoses Final diagnoses:  None    Rx / DC Orders ED Discharge Orders     None         Theron Arista,  PA-C 02/24/22 1940    Gwyneth SproutPlunkett, [place], MD 03/03/22 1642

## 2022-02-24 NOTE — ED Triage Notes (Signed)
Pt arrived POV from home c/o centralized CP that started about 3-4 hrs ago. Pt also endorses some dizziness. Pt states the pain radiates to his neck. EMS gave 324 ASA.

## 2022-02-24 NOTE — ED Notes (Signed)
Pt called out, stating he was ready to leave. RN at bedside and explained to pt that he was not up for discharge yet. RN explained that MD had ordered more lab work and some medication. Pt agreeable to medication but refused more lab work

## 2023-01-09 IMAGING — CT CT CHEST-ABD-PELV W/ CM
2 of 5 series · 12 of 36 positions shown, 14 images · IV contrast (APPLIED)
Comparison: None.

CLINICAL DATA: Left upper quadrant abdominal pain. Shortness of
breath.

EXAM:
CT CHEST, ABDOMEN, AND PELVIS WITH CONTRAST
TECHNIQUE: Multidetector CT imaging of the chest, abdomen and pelvis was
performed following the standard protocol during bolus
administration of intravenous contrast.

[Series 3: cap 5.0 i31f 2 · axial · 0.79mm/px · z∈[-621,-61]mm · 9 of 140 slices shown, 11 images]
[im 14/140  mediastinal]
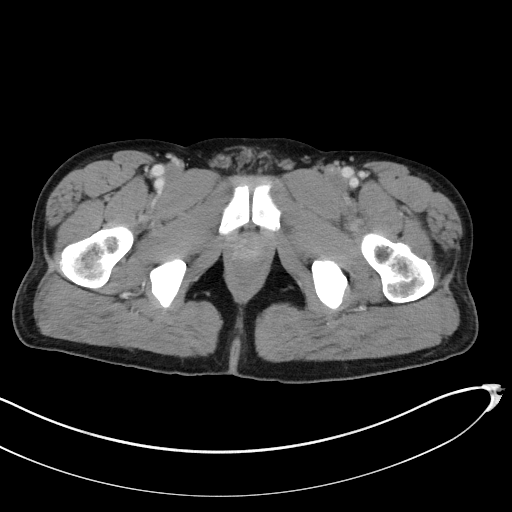
[im 14/140  bone]
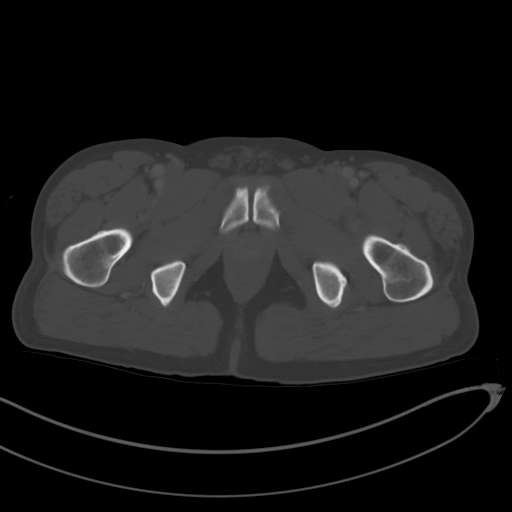
[im 28/140  mediastinal]
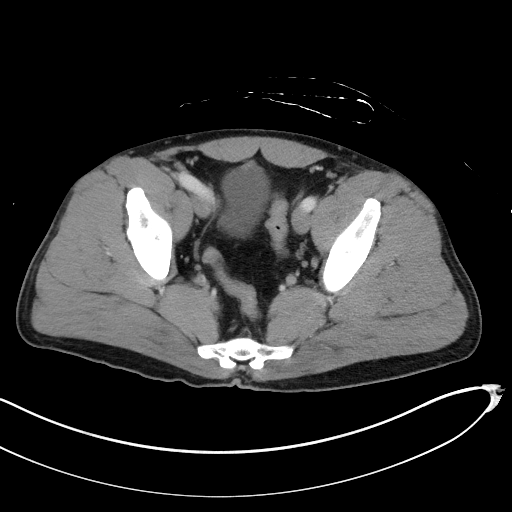
[im 42/140  mediastinal]
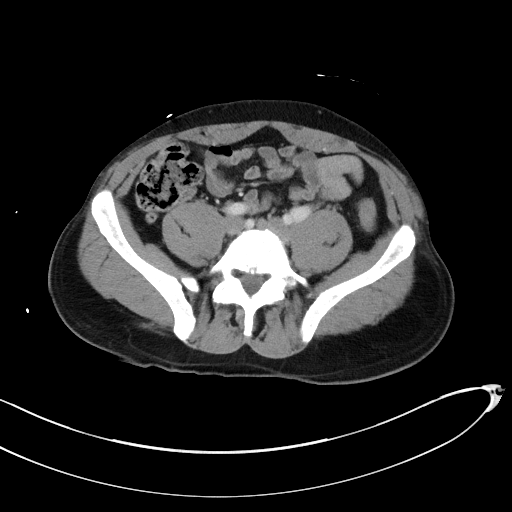
[im 56/140  mediastinal]
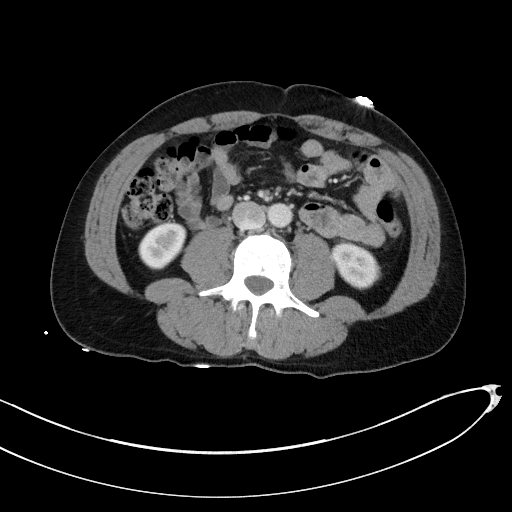
[im 70/140  mediastinal]
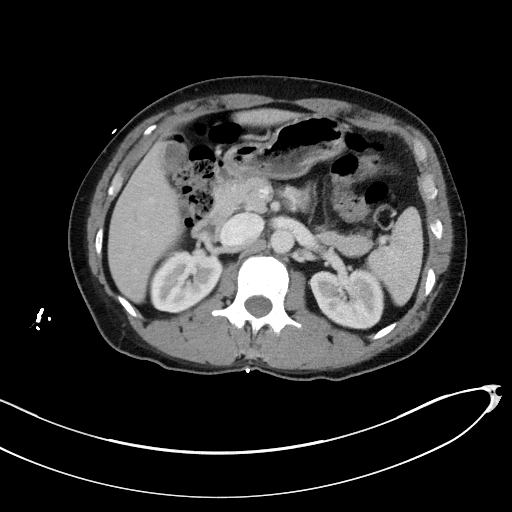
[im 84/140  mediastinal]
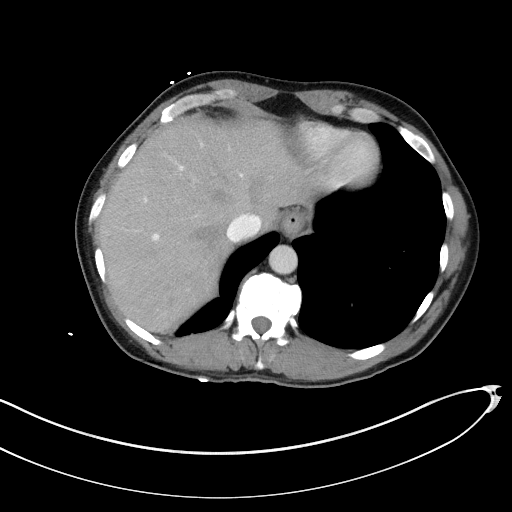
[im 98/140  mediastinal]
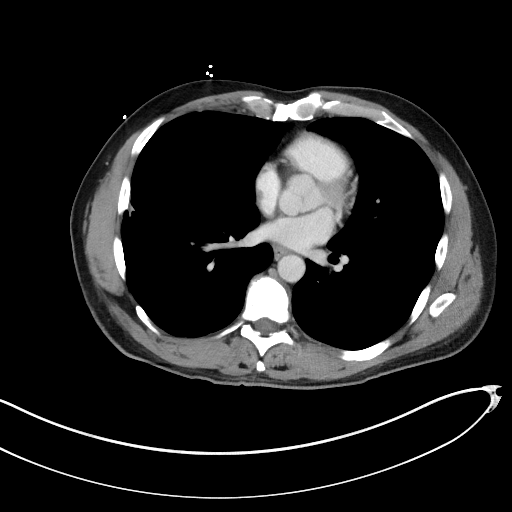
[im 112/140  mediastinal]
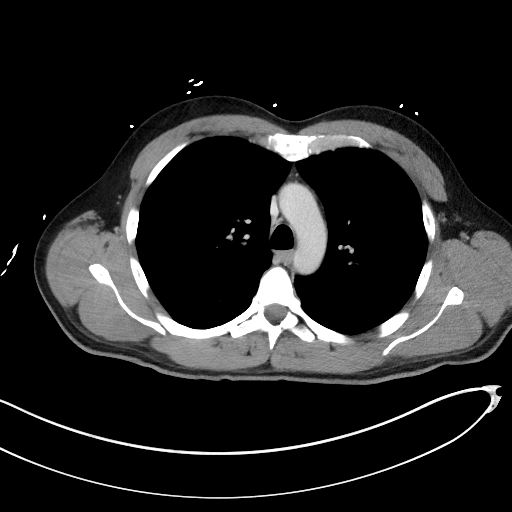
[im 126/140  mediastinal]
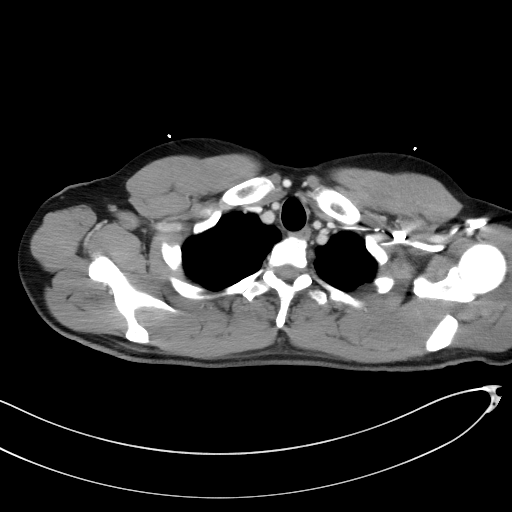
[im 126/140  bone]
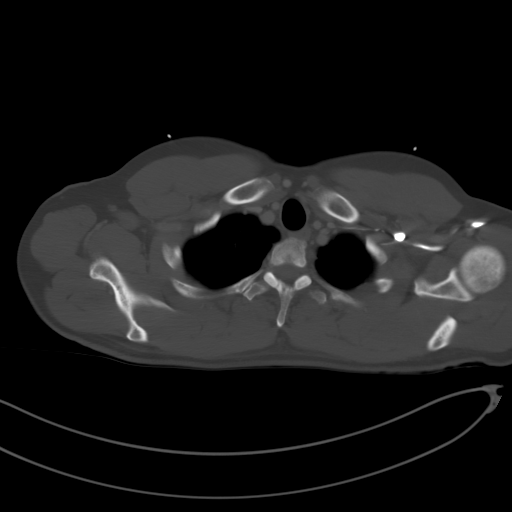

[Series 6: coronal · coronal · 0.87mm/px · 3 of 148 slices shown]
[im 30/148  mediastinal]
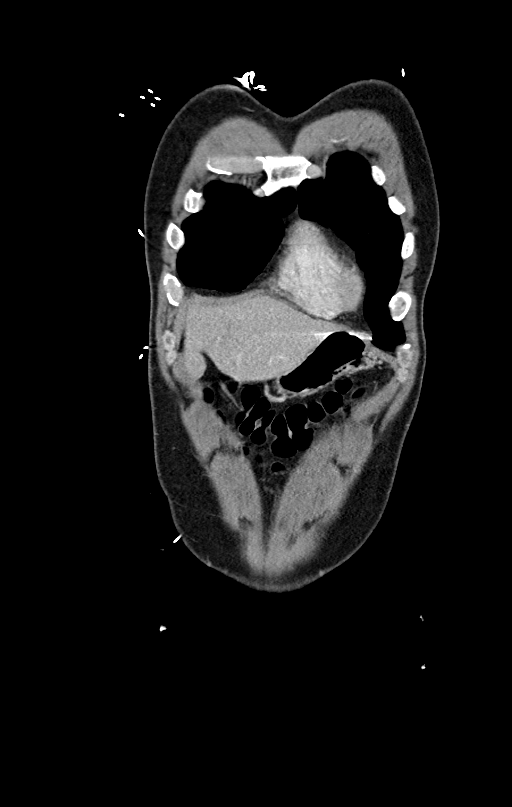
[im 59/148  mediastinal]
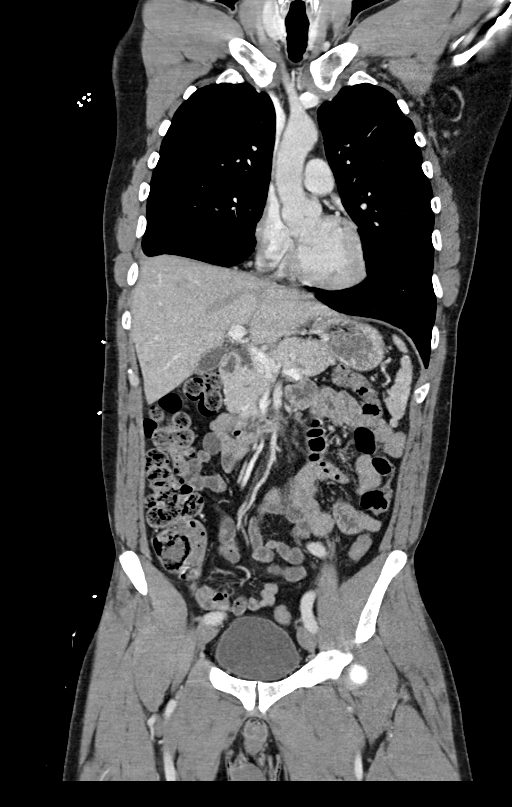
[im 89/148  mediastinal]
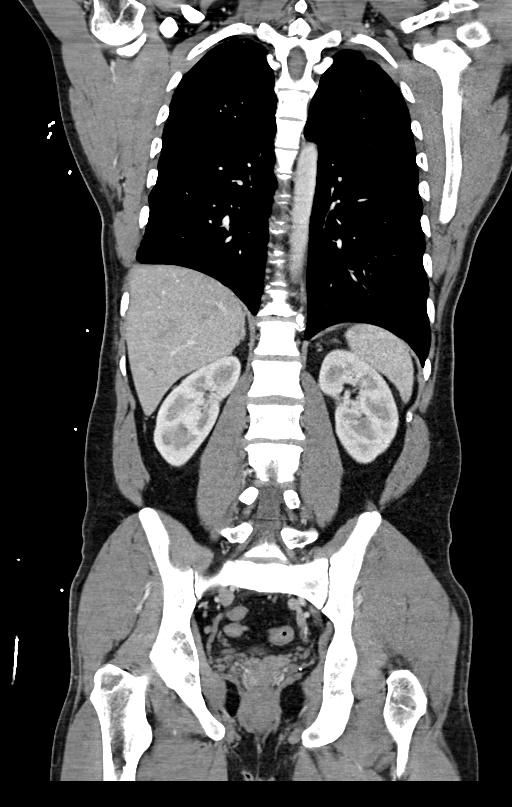

[12 of 36 positions shown; findings below may reference images not displayed]

RADIATION DOSE REDUCTION: This exam was performed according to the
departmental dose-optimization program which includes automated
exposure control, adjustment of the mA and/or kV according to
patient size and/or use of iterative reconstruction technique.

CONTRAST:  80mL OMNIPAQUE IOHEXOL 350 MG/ML SOLN
FINDINGS: CT CHEST FINDINGS

Cardiovascular: No significant vascular findings. Normal heart size.
No pericardial effusion.

Mediastinum/Nodes: No enlarged mediastinal, hilar, or axillary lymph
nodes. Thyroid gland, trachea, and esophagus demonstrate no
significant findings.

Lungs/Pleura: There is minimal scarring in the right lower lobe. The
lungs are otherwise clear. There is no pleural effusion or
pneumothorax.

Musculoskeletal: A portion of the right eighth rib is missing which
may be related to prior surgery. No acute fractures are seen.

CT ABDOMEN PELVIS FINDINGS

Hepatobiliary: No focal liver abnormality is seen. No gallstones,
gallbladder wall thickening, or biliary dilatation.

Pancreas: Unremarkable. No pancreatic ductal dilatation or
surrounding inflammatory changes.

Spleen: Normal in size without focal abnormality.

Adrenals/Urinary Tract: Adrenal glands are unremarkable. Kidneys are
normal, without renal calculi, focal lesion, or hydronephrosis.
Bladder is unremarkable.

Stomach/Bowel: Stomach is within normal limits. Appendix appears
normal. No evidence of bowel wall thickening, distention, or
inflammatory changes.

Vascular/Lymphatic: Aortic atherosclerosis. No enlarged abdominal or
pelvic lymph nodes.

Reproductive: Prostate is unremarkable.

Other: No abdominal wall hernia or abnormality. No abdominopelvic
ascites.

Musculoskeletal: No acute or significant osseous findings.
IMPRESSION: 1. No acute localizing process in the chest, abdomen or pelvis.

## 2023-02-04 IMAGING — CT CT HEAD W/O CM
4 series · 16 of 47 positions shown, 18 images · non-contrast
Comparison: None.

CLINICAL DATA: Headache.



[Series 3: head wo · axial · 0.43mm/px · z∈[-511,-396]mm · 7 of 31 slices shown, 9 images]
[im 4/31  brain]
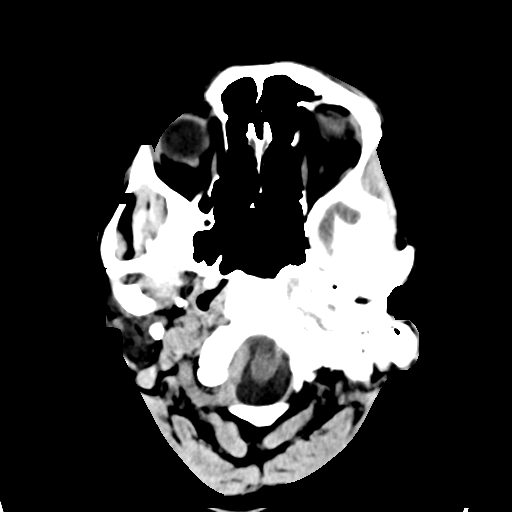
[im 4/31  bone]
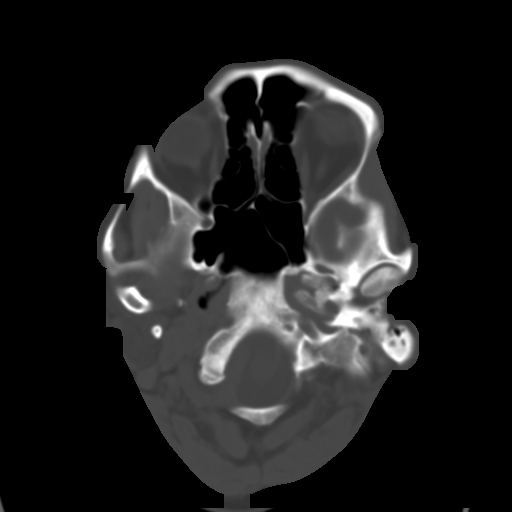
[im 8/31  brain]
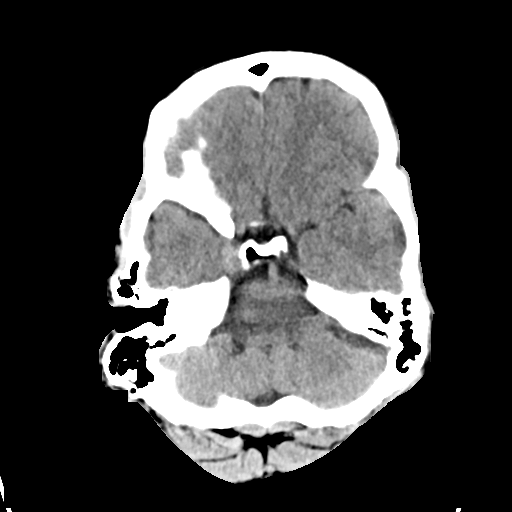
[im 12/31  brain]
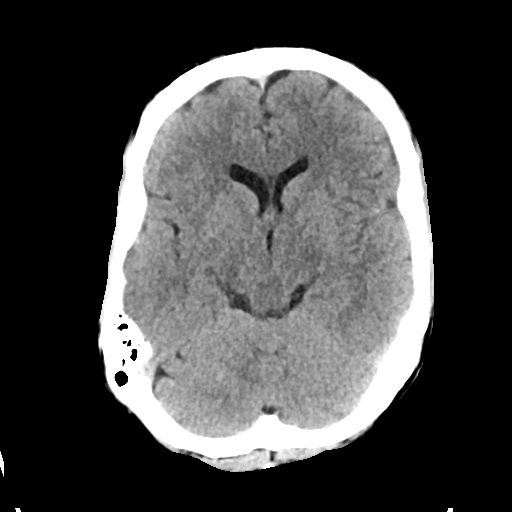
[im 16/31  brain]
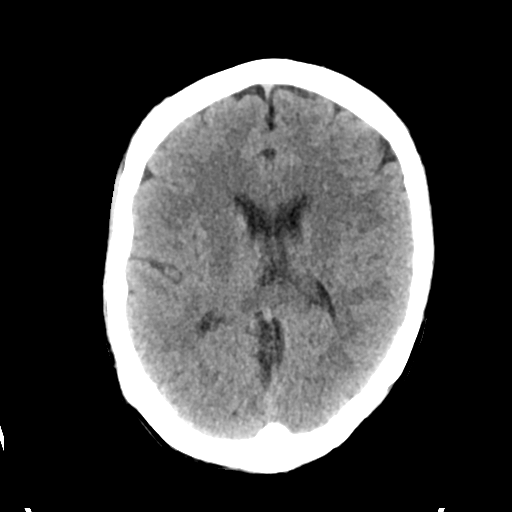
[im 19/31  brain]
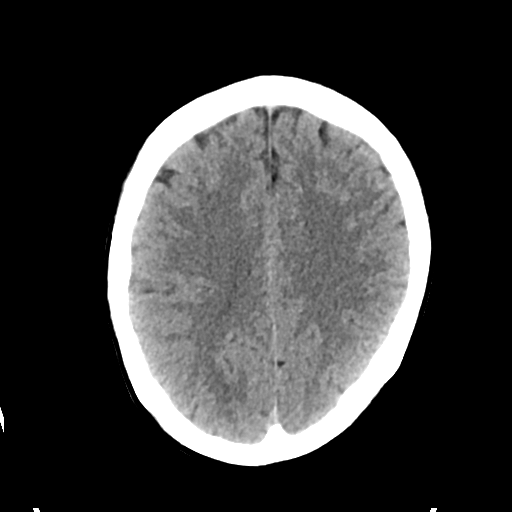
[im 19/31  bone]
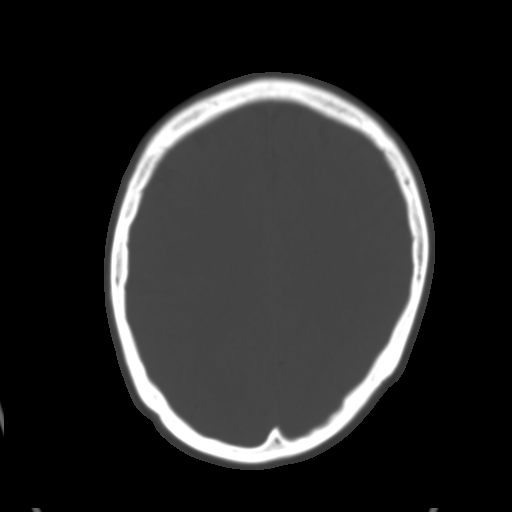
[im 23/31  brain]
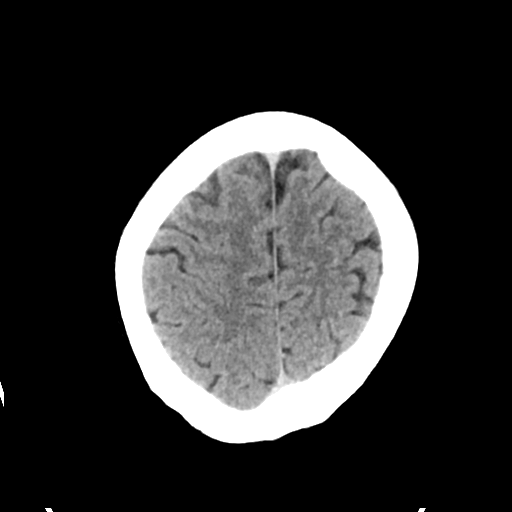
[im 27/31  brain]
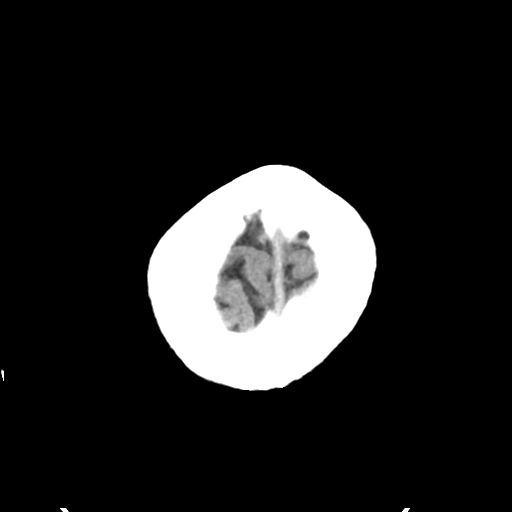

[Series 4: head bone · axial · 0.43mm/px · z∈[-512,-482]mm · 3 of 76 slices shown]
[im 8/76  bone]
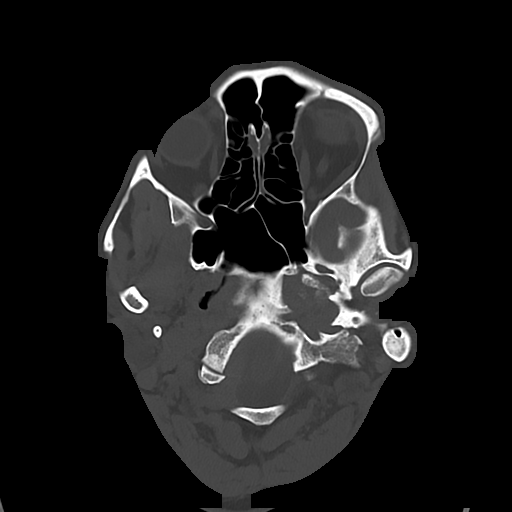
[im 16/76  bone]
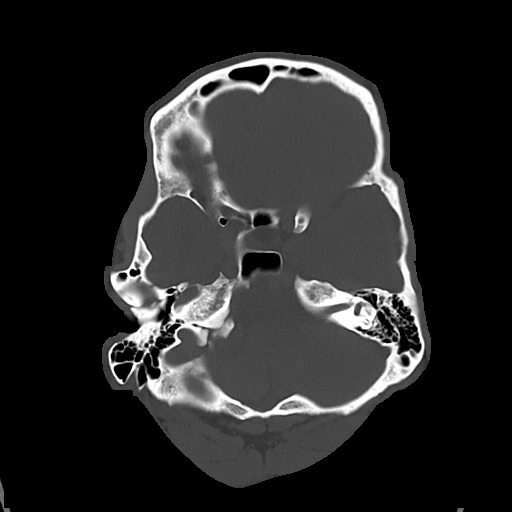
[im 23/76  bone]
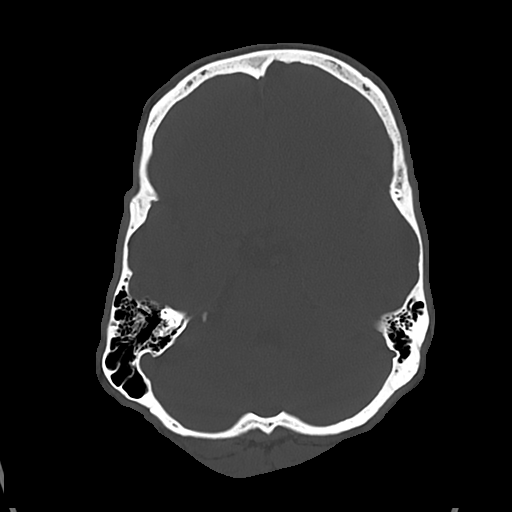

[Series 5: cor soft · coronal · 0.33mm/px · 3 of 73 slices shown]
[im 33/73  brain]
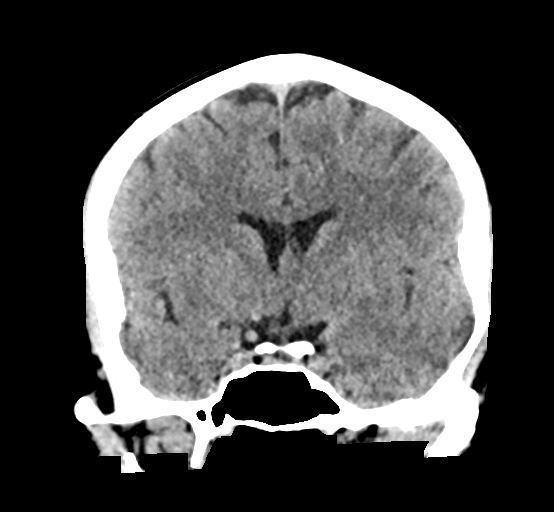
[im 40/73  brain]
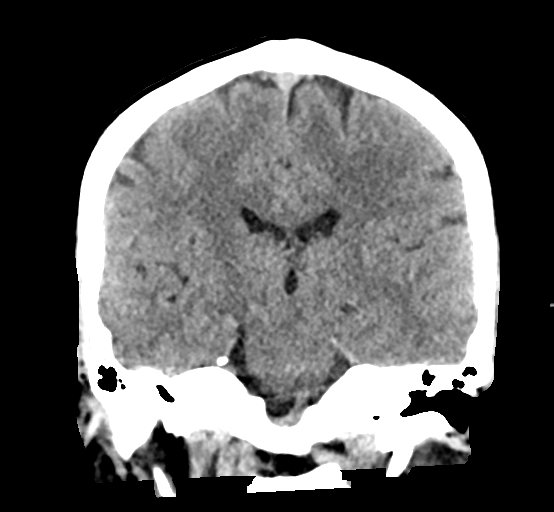
[im 48/73  brain]
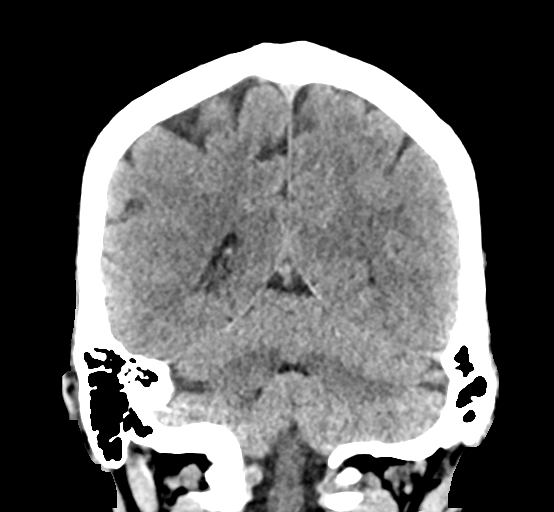

[Series 6: sag soft · sagittal · 0.33mm/px · 3 of 62 slices shown]
[im 21/62  brain]
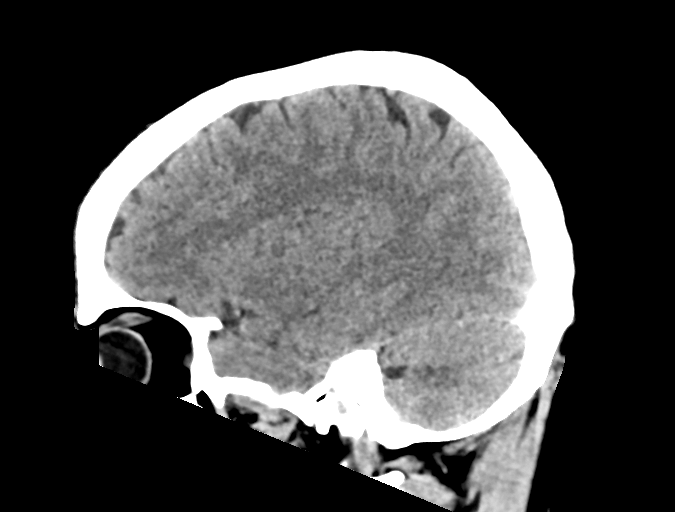
[im 31/62  brain]
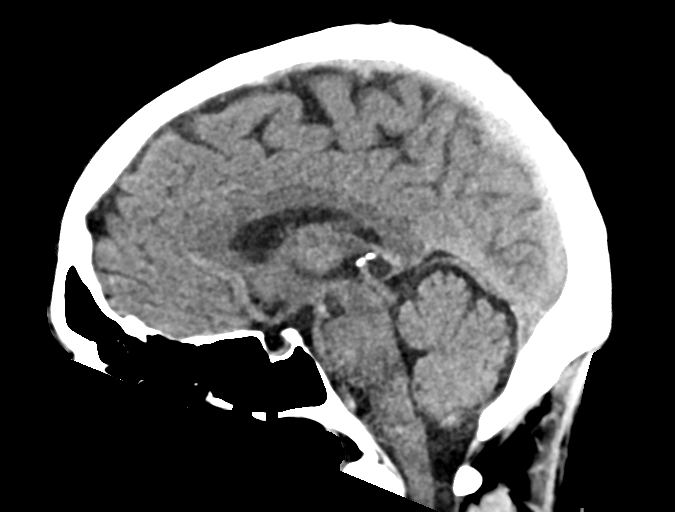
[im 41/62  brain]
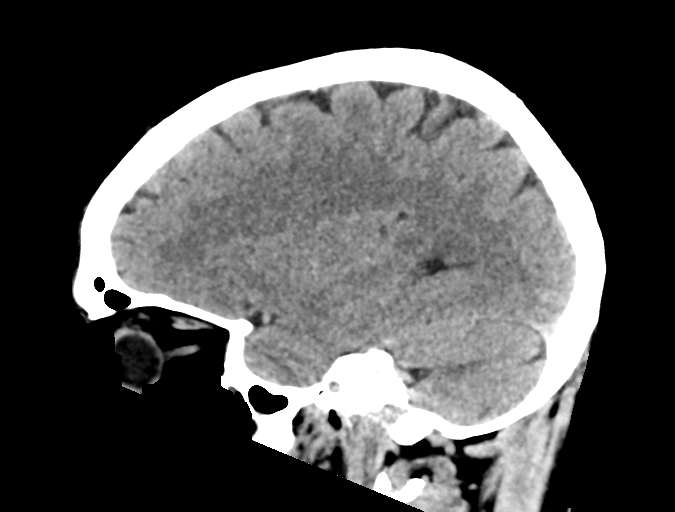

[16 of 47 positions shown; findings below may reference images not displayed]

FINDINGS: Brain: No evidence of acute infarction, hemorrhage, hydrocephalus,
extra-axial collection or mass lesion/mass effect.

Vascular: No hyperdense vessel or unexpected calcification.

Skull: Normal. Negative for fracture or focal lesion.

Sinuses/Orbits: No acute finding.

Other: None.
IMPRESSION: No acute intracranial abnormality seen.
